# Patient Record
Sex: Female | Born: 1990 | Hispanic: No | Marital: Single | Smoking: Never smoker
Health system: Southern US, Community
[De-identification: ages and names within clinical notes are randomized; demographics above are authoritative.]

## PROBLEM LIST (undated history)

## (undated) DIAGNOSIS — J45909 Unspecified asthma, uncomplicated: Secondary | ICD-10-CM

## (undated) DIAGNOSIS — K219 Gastro-esophageal reflux disease without esophagitis: Secondary | ICD-10-CM

## (undated) DIAGNOSIS — A499 Bacterial infection, unspecified: Secondary | ICD-10-CM

## (undated) HISTORY — DX: Gastro-esophageal reflux disease without esophagitis: K21.9

---
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
## 2012-06-14 ENCOUNTER — Encounter (HOSPITAL_BASED_OUTPATIENT_CLINIC_OR_DEPARTMENT_OTHER): Payer: Self-pay | Admitting: Emergency Medicine

## 2012-06-14 ENCOUNTER — Emergency Department (HOSPITAL_BASED_OUTPATIENT_CLINIC_OR_DEPARTMENT_OTHER)
Admission: EM | Admit: 2012-06-14 | Discharge: 2012-06-14 | Disposition: A | Discharge status: Home / Self Care | Payer: Medicaid Other | Attending: Emergency Medicine | Admitting: Emergency Medicine

## 2012-06-14 DIAGNOSIS — H1045 Other chronic allergic conjunctivitis: Secondary | ICD-10-CM | POA: Insufficient documentation

## 2012-06-14 DIAGNOSIS — K219 Gastro-esophageal reflux disease without esophagitis: Secondary | ICD-10-CM | POA: Insufficient documentation

## 2012-06-14 DIAGNOSIS — H1010 Acute atopic conjunctivitis, unspecified eye: Secondary | ICD-10-CM

## 2012-06-14 MED ORDER — NAPHAZOLINE-PHENIRAMINE 0.025-0.3 % OP SOLN: 2.0000 [drp] | Freq: Four times a day (QID) | OPHTHALMIC | Status: DC

## 2012-06-14 NOTE — ED Provider Notes (Signed)
History     CSN: [id number]  Arrival date & time 06/14/12  1850   First MD Initiated Contact with Patient 06/14/12 1901      Chief Complaint  Patient presents with  . Conjunctivitis    (Consider location/radiation/quality/duration/timing/severity/associated sxs/prior treatment) Patient is a 21 y.o. female presenting with conjunctivitis. The history is provided by the patient.  Conjunctivitis  The current episode started 3 to 5 days ago. The problem occurs continuously. The problem has been unchanged. Associated symptoms include eye itching, eye discharge and eye redness. Pertinent negatives include no photophobia and no congestion. Associated symptoms comments: Redness, swelling and clear drainage isolated to left eye for 3 days without morning eyelash matting. No injury or foreign body..    Past Medical History  Diagnosis Date  . Gastroesophageal reflux     Avoid dietary chocolate, caffeine & peppermint    History reviewed. No pertinent past surgical history.  Family History  Problem Relation Age of Onset  . GER disease Brother     History  Substance Use Topics  . Smoking status: Not on file  . Smokeless tobacco: Not on file  . Alcohol Use:     OB History    Grav Para Term Preterm Abortions TAB SAB Ect Mult Living                  Review of Systems  HENT: Negative for congestion.   Eyes: Positive for discharge, redness and itching. Negative for photophobia.    Allergies  Review of patient's allergies indicates no known allergies.  Home Medications   Current Outpatient Rx  Name Route Sig Dispense Refill  . OMEPRAZOLE 40 MG PO CPDR Oral Take 1 capsule (40 mg total) by mouth daily. 30 capsule 11    BP 141/73  Pulse 81  Temp 98.4 F (36.9 C) (Oral)  Resp 16  Ht 5\' 6"  (1.676 m)  Wt 180 lb (81.647 kg)  BMI 29.05 kg/m2  SpO2 100%  LMP 05/20/2012  Physical Exam  Constitutional: She is oriented to person, place, and time. She appears well-developed  and well-nourished.  Eyes:       Left eye injected with mild chemosis. No FB visualized. PERRL. FROM. No eye lid swelling or redness. Right eye unremarkable in appearance.  Neck: Normal range of motion.  Pulmonary/Chest: Effort normal.  Neurological: She is alert and oriented to person, place, and time.  Skin: Skin is warm and dry.    ED Course  Procedures (including critical care time)  Labs Reviewed - No data to display No results found.   No diagnosis found.  1. Allergic conjunctivitis.  MDM  Suspect allergic component to symptoms with chemosis. Will treat with antihistamine eye drops and refer to ophthalmology for recheck.        Rodena Medin, PA-C 06/14/12 1942

## 2012-06-14 NOTE — ED Provider Notes (Signed)
Medical screening examination/treatment/procedure(s) were performed by non-physician practitioner and as supervising physician I was immediately available for consultation/collaboration.   Geoffery Lyons, MD 06/14/12 2100

## 2012-06-14 NOTE — ED Notes (Signed)
C/O left eye pain with redness and drainage for past 3 days.

## 2012-09-11 ENCOUNTER — Emergency Department (HOSPITAL_BASED_OUTPATIENT_CLINIC_OR_DEPARTMENT_OTHER)
Admission: EM | Admit: 2012-09-11 | Discharge: 2012-09-12 | Disposition: A | Discharge status: Home / Self Care | Payer: Medicaid Other | Attending: Emergency Medicine | Admitting: Emergency Medicine

## 2012-09-11 ENCOUNTER — Encounter (HOSPITAL_BASED_OUTPATIENT_CLINIC_OR_DEPARTMENT_OTHER): Payer: Self-pay | Admitting: *Deleted

## 2012-09-11 ENCOUNTER — Emergency Department (HOSPITAL_BASED_OUTPATIENT_CLINIC_OR_DEPARTMENT_OTHER): Payer: Medicaid Other

## 2012-09-11 DIAGNOSIS — N390 Urinary tract infection, site not specified: Secondary | ICD-10-CM | POA: Insufficient documentation

## 2012-09-11 DIAGNOSIS — M549 Dorsalgia, unspecified: Secondary | ICD-10-CM

## 2012-09-11 DIAGNOSIS — K219 Gastro-esophageal reflux disease without esophagitis: Secondary | ICD-10-CM | POA: Insufficient documentation

## 2012-09-11 DIAGNOSIS — R300 Dysuria: Secondary | ICD-10-CM | POA: Insufficient documentation

## 2012-09-11 DIAGNOSIS — Z8619 Personal history of other infectious and parasitic diseases: Secondary | ICD-10-CM | POA: Insufficient documentation

## 2012-09-11 DIAGNOSIS — Z3202 Encounter for pregnancy test, result negative: Secondary | ICD-10-CM | POA: Insufficient documentation

## 2012-09-11 DIAGNOSIS — Z79899 Other long term (current) drug therapy: Secondary | ICD-10-CM | POA: Insufficient documentation

## 2012-09-11 HISTORY — DX: Bacterial infection, unspecified: A49.9

## 2012-09-11 LAB — URINALYSIS, ROUTINE W REFLEX MICROSCOPIC
Bilirubin Urine: NEGATIVE
Glucose, UA: NEGATIVE mg/dL
Ketones, ur: NEGATIVE mg/dL
Nitrite: NEGATIVE
Protein, ur: 100 mg/dL — AB
Specific Gravity, Urine: 1.03 (ref 1.005–1.030)
Urobilinogen, UA: 0.2 mg/dL (ref 0.0–1.0)
pH: 5 (ref 5.0–8.0)

## 2012-09-11 LAB — URINE MICROSCOPIC-ADD ON

## 2012-09-11 LAB — PREGNANCY, URINE: Preg Test, Ur: NEGATIVE

## 2012-09-11 MED ORDER — HYDROCODONE-ACETAMINOPHEN 5-325 MG PO TABS: 2.0000 | ORAL_TABLET | ORAL | Status: DC | PRN

## 2012-09-11 MED ORDER — CEPHALEXIN 500 MG PO CAPS: 500.0000 mg | ORAL_CAPSULE | Freq: Four times a day (QID) | ORAL | Status: DC
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
## 2012-09-11 NOTE — ED Notes (Signed)
Patient states that she is having lower right back pain, and urine frequency and burning.
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
## 2012-09-12 MED ORDER — ONDANSETRON 4 MG PO TBDP
4.0000 mg | ORAL_TABLET | Freq: Once | ORAL | Status: AC
  Administered 2012-09-12: 4 mg via ORAL
  Filled 2012-09-12: qty 1

## 2012-09-12 NOTE — ED Provider Notes (Signed)
Medical screening examination/treatment/procedure(s) were performed by non-physician practitioner and as supervising physician I was immediately available for consultation/collaboration.   Dariel Pellecchia Y. Indya Oliveria, MD 09/12/12 1609 

## 2012-09-12 NOTE — ED Notes (Signed)
Pt c/o nausea at d/c. MD updated.

## 2012-09-13 LAB — URINE CULTURE

## 2013-10-08 IMAGING — CR DG LUMBAR SPINE COMPLETE 4+V
5 series · 5 of 5 positions shown · non-contrast
Comparison: None.

CLINICAL DATA: Back pain

LUMBAR SPINE - COMPLETE 4+ VIEW

[t l-spine a.p.]
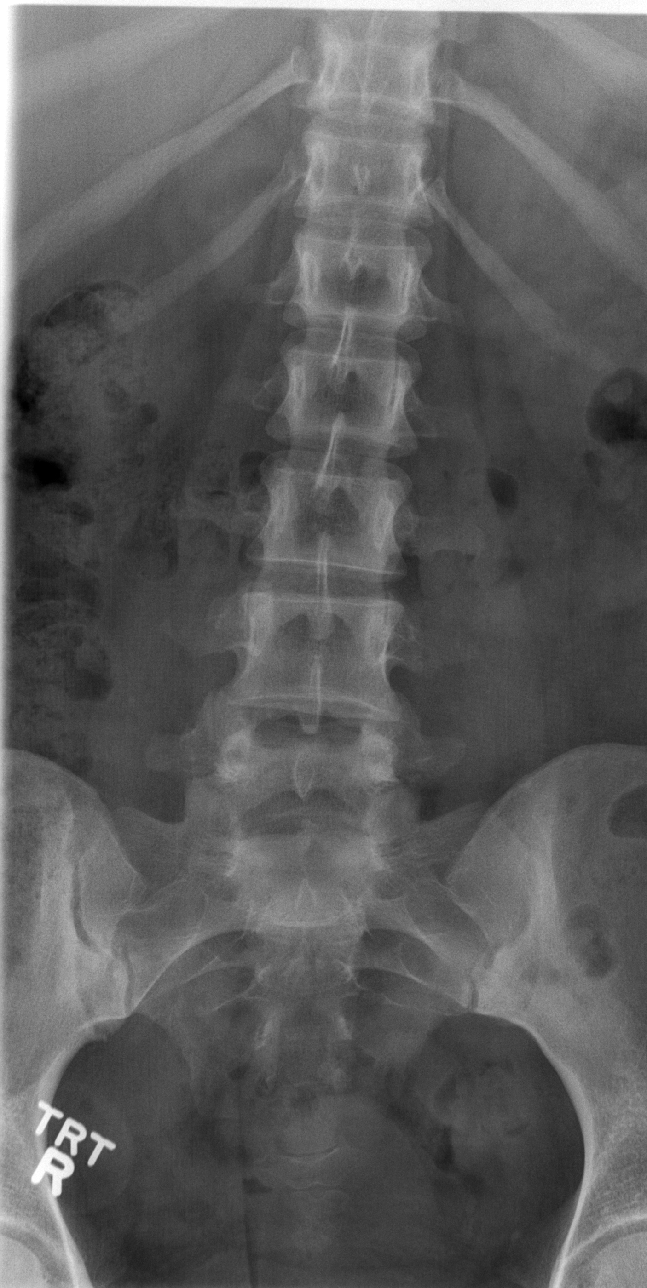

[t l-spine oblique exposure (1 of 2)]
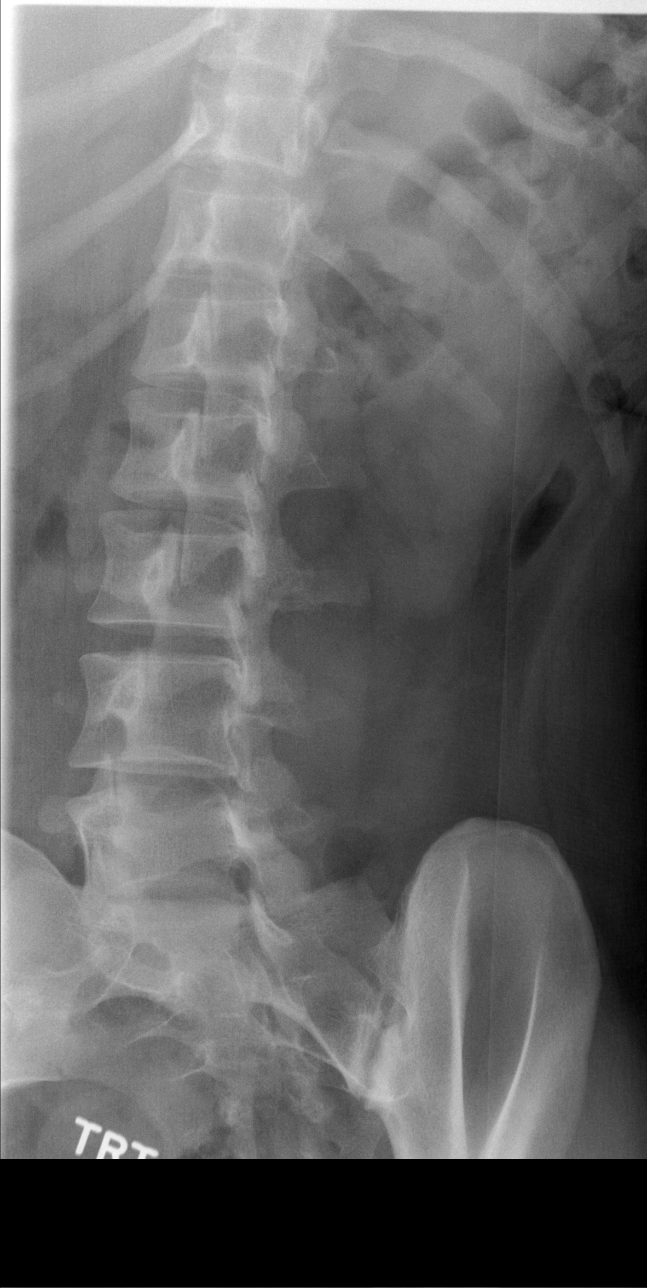

[t l-spine oblique exposure (2 of 2)]
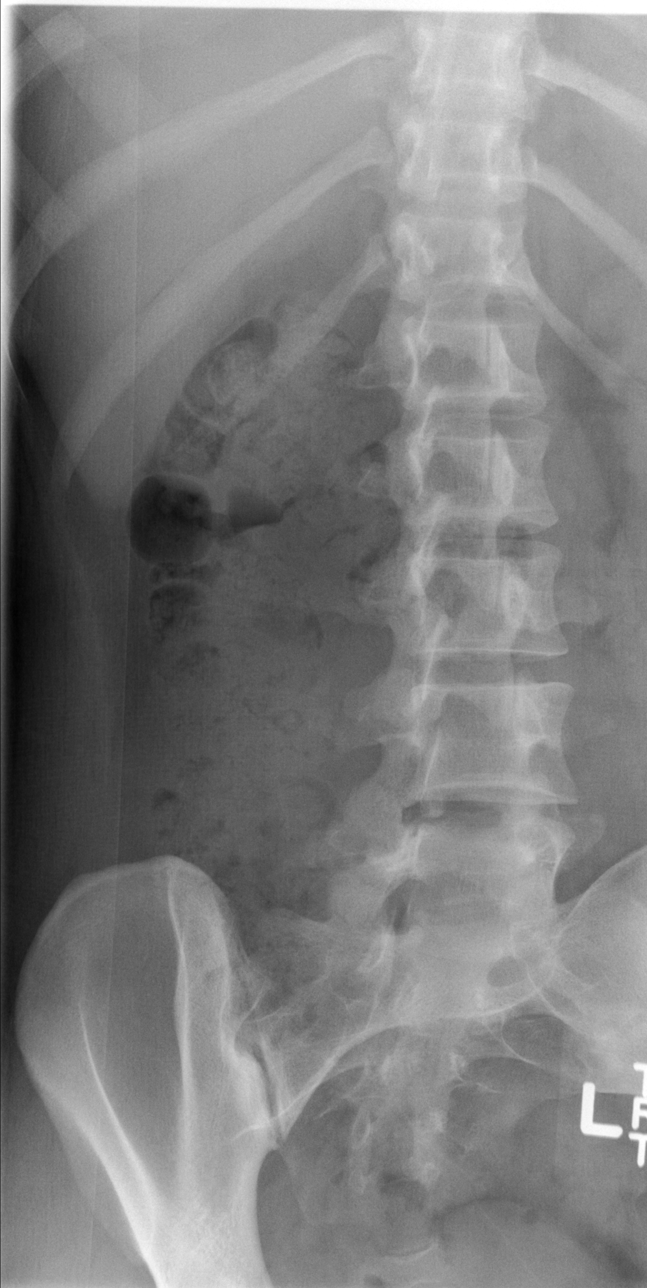

[t l-spine lat]
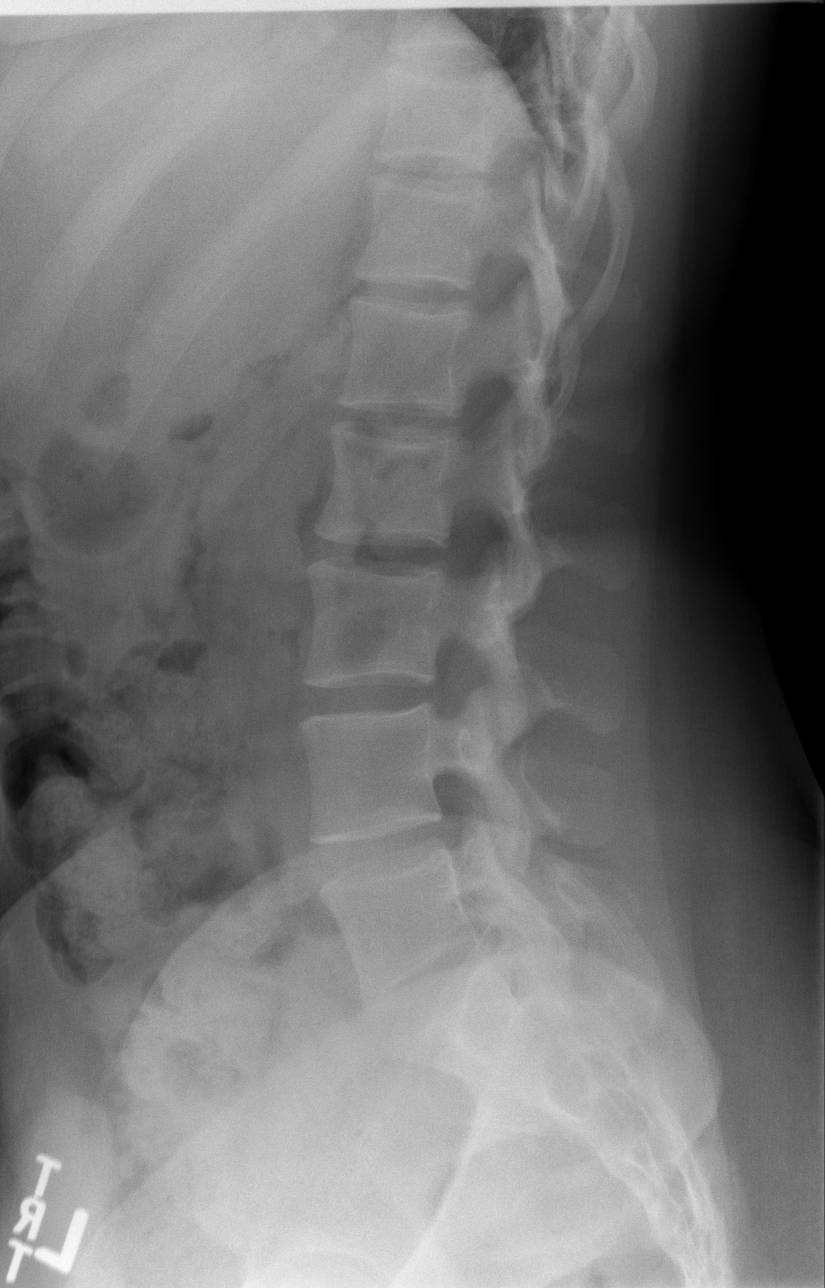

[t l-spine l5-s1 spot]
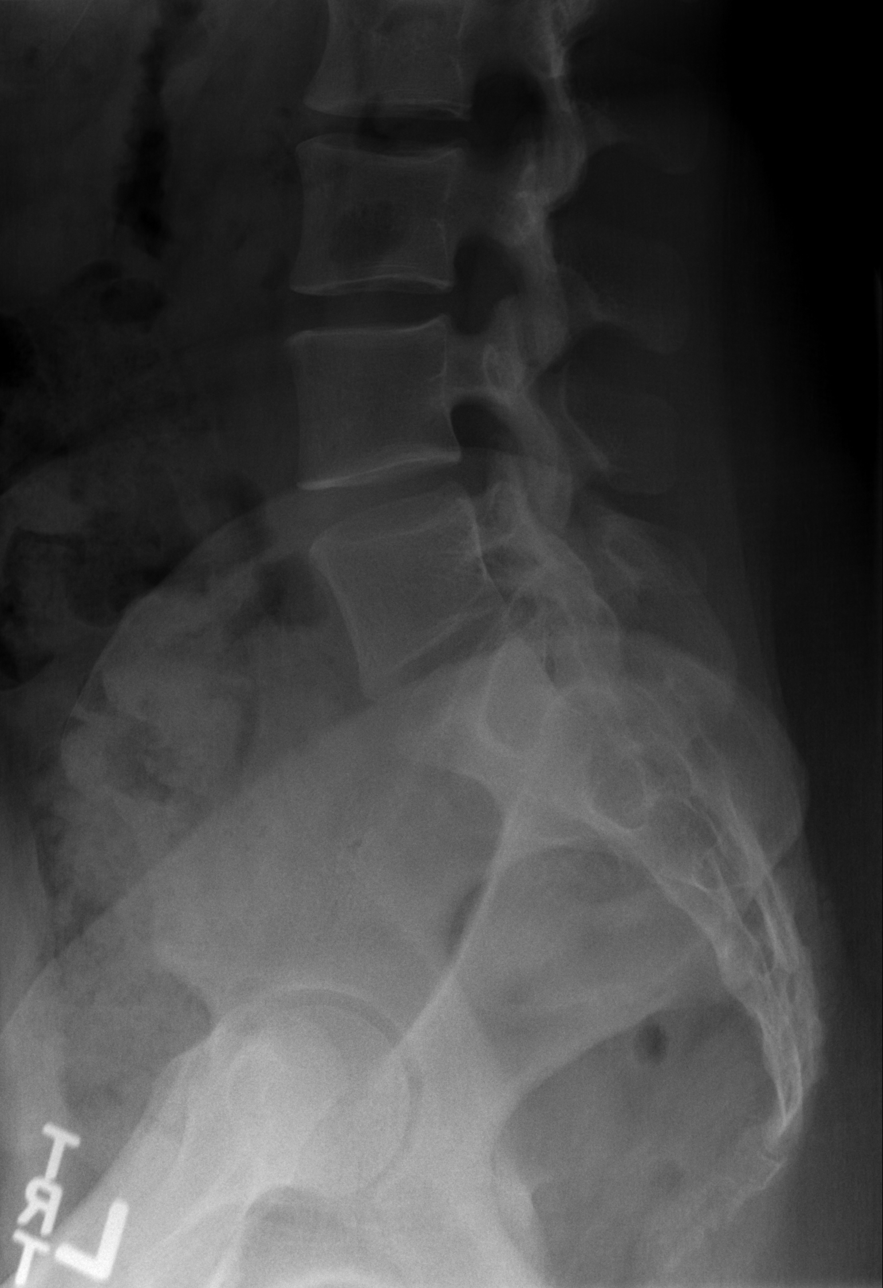

[5 of 5 positions shown; findings below may reference images not displayed]

FINDINGS: Normal alignment of the lumbar vertebral bodies.  No loss
of vertebral body height or disc height.  No subluxation.  No pars
fracture.
IMPRESSION: No acute osseous abnormality.

## 2015-03-25 ENCOUNTER — Encounter (HOSPITAL_BASED_OUTPATIENT_CLINIC_OR_DEPARTMENT_OTHER): Payer: Self-pay | Admitting: Emergency Medicine

## 2015-03-25 ENCOUNTER — Emergency Department (HOSPITAL_BASED_OUTPATIENT_CLINIC_OR_DEPARTMENT_OTHER)
Admission: EM | Admit: 2015-03-25 | Discharge: 2015-03-25 | Disposition: A | Discharge status: Home / Self Care | Payer: Medicaid Other | Attending: Emergency Medicine | Admitting: Emergency Medicine

## 2015-03-25 DIAGNOSIS — K219 Gastro-esophageal reflux disease without esophagitis: Secondary | ICD-10-CM | POA: Insufficient documentation

## 2015-03-25 DIAGNOSIS — J069 Acute upper respiratory infection, unspecified: Secondary | ICD-10-CM | POA: Insufficient documentation

## 2015-03-25 DIAGNOSIS — H6503 Acute serous otitis media, bilateral: Secondary | ICD-10-CM | POA: Insufficient documentation

## 2015-03-25 DIAGNOSIS — Z79899 Other long term (current) drug therapy: Secondary | ICD-10-CM | POA: Insufficient documentation

## 2015-03-25 DIAGNOSIS — Z8619 Personal history of other infectious and parasitic diseases: Secondary | ICD-10-CM | POA: Insufficient documentation

## 2015-03-25 LAB — RAPID STREP SCREEN (MED CTR MEBANE ONLY): STREPTOCOCCUS, GROUP A SCREEN (DIRECT): NEGATIVE

## 2015-03-25 MED ORDER — AMOXICILLIN 500 MG PO CAPS: 1000.0000 mg | ORAL_CAPSULE | Freq: Three times a day (TID) | ORAL | Status: DC

## 2015-03-25 MED ORDER — ONDANSETRON 8 MG PO TBDP: 8.0000 mg | ORAL_TABLET | Freq: Three times a day (TID) | ORAL | Status: AC | PRN

## 2015-03-25 MED ORDER — HYDROCODONE-ACETAMINOPHEN 5-325 MG PO TABS: 1.0000 | ORAL_TABLET | Freq: Four times a day (QID) | ORAL | Status: AC | PRN

## 2015-03-25 MED ORDER — AMOXICILLIN 500 MG PO CAPS
1000.0000 mg | ORAL_CAPSULE | Freq: Once | ORAL | Status: AC
  Administered 2015-03-25: 1000 mg via ORAL
  Filled 2015-03-25: qty 2

## 2015-03-25 NOTE — ED Notes (Signed)
Pt reports sore throat and bilat ear pain x 1 week as well as nasal congestion

## 2015-03-25 NOTE — ED Provider Notes (Signed)
CSN: [id number]     Arrival date & time 03/25/15  0029 History   First MD Initiated Contact with Patient 03/25/15 [phone]     Chief Complaint  Patient presents with  . Sore Throat and Ear Pain      (Consider location/radiation/quality/duration/timing/severity/associated sxs/prior Treatment) HPI  This is a 24 year old female with a 6 day history of cold symptoms. She has had nasal congestion, productive cough, nausea, vomiting, sore throat and now pain in her right ear. She has been using over-the-counter medications without adequate relief. She has not had diarrhea. She states the pain in her right ear is been severe enough to have her crying. Her ears feel stuffy and her hearing is decreased.  Past Medical History  Diagnosis Date  . Gastroesophageal reflux     Avoid dietary chocolate, caffeine & peppermint  . Bacterial infection    History reviewed. No pertinent past surgical history. Family History  Problem Relation Age of Onset  . GER disease Brother    History  Substance Use Topics  . Smoking status: Never Smoker   . Smokeless tobacco: Never Used  . Alcohol Use: No   OB History    No data available     Review of Systems  All other systems reviewed and are negative.   Allergies  Review of patient's allergies indicates no known allergies.  Home Medications   Prior to Admission medications   Medication Sig Start Date End Date Taking? Authorizing Provider  cephALEXin (KEFLEX) 500 MG capsule Take 1 capsule (500 mg total) by mouth 4 (four) times daily. 09/11/12   Elson Areas, PA-C  HYDROcodone-acetaminophen (NORCO/VICODIN) 5-325 MG per tablet Take 2 tablets by mouth every 4 (four) hours as needed for pain. 09/11/12   Elson Areas, PA-C  metroNIDAZOLE (FLAGYL) 500 MG tablet Take 500 mg by mouth 3 (three) times daily.    Historical Provider, MD  naphazoline-pheniramine (NAPHCON-A) 0.025-0.3 % ophthalmic solution Place 2 drops into the left eye 4 (four) times daily.  06/14/12   Elpidio Anis, PA-C  omeprazole (PRILOSEC) 40 MG capsule Take 1 capsule (40 mg total) by mouth daily. 04/08/11   Jon Gills, MD   BP 133/82 mmHg  Pulse 60  Temp(Src) 98 F (36.7 C) (Oral)  Resp 17  SpO2 100%  LMP 03/12/2015   Physical Exam  General: Well-developed, well-nourished female in no acute distress; appearance consistent with age of record HENT: normocephalic; atraumatic; pharyngeal erythema without exudate; nasal congestion; TMs without erythema Eyes: pupils equal, round and reactive to light; extraocular muscles intact Neck: supple Heart: regular rate and rhythm Lungs: clear to auscultation bilaterally Abdomen: soft; nondistended; nontender; bowel sounds present Extremities: No deformity; full range of motion Neurologic: Awake, alert and oriented; motor function intact in all extremities and symmetric; no facial droop Skin: Warm and dry Psychiatric: Normal mood and affect    ED Course  Procedures (including critical care time)   MDM   Nursing notes and vitals signs, including pulse oximetry, reviewed.  Summary of this visit's results, reviewed by myself:  Labs:  Results for orders placed or performed during the hospital encounter of 03/25/15 (from the past 24 hour(s))  Rapid strep screen     Status: None   Collection Time: 03/25/15  1:35 AM  Result Value Ref Range   Streptococcus, Group A Screen (Direct) NEGATIVE NEGATIVE   History and exam consistent with serous otitis media. Because her symptoms are worsening despite treatment we will treat with an anti-biotic.  Paula Libra, MD 03/25/15 [phone]

## 2015-03-25 NOTE — ED Notes (Addendum)
Pt complains of pain in BIL ears R>L, and sore throat 7/10 since Monday. Has been treating at home with honey, ibuprofen, dayquil and Mucinex. Patient took Dayquil, Ibuprofen, and Zycam around 2300. Also complains of productive cough with "a fair amount" of clear/yellow mucous, nausea and vomiting but denies diarrhea. Only complains of dizziness "if I get up too quick".

## 2015-03-28 LAB — CULTURE, GROUP A STREP: Strep A Culture: NEGATIVE

## 2017-09-16 ENCOUNTER — Other Ambulatory Visit: Payer: Self-pay

## 2017-09-16 ENCOUNTER — Emergency Department (HOSPITAL_BASED_OUTPATIENT_CLINIC_OR_DEPARTMENT_OTHER)
Admission: EM | Admit: 2017-09-16 | Discharge: 2017-09-16 | Disposition: A | Discharge status: Home / Self Care | Payer: Self-pay | Attending: Emergency Medicine | Admitting: Emergency Medicine

## 2017-09-16 ENCOUNTER — Encounter (HOSPITAL_BASED_OUTPATIENT_CLINIC_OR_DEPARTMENT_OTHER): Payer: Self-pay

## 2017-09-16 DIAGNOSIS — J45909 Unspecified asthma, uncomplicated: Secondary | ICD-10-CM | POA: Insufficient documentation

## 2017-09-16 DIAGNOSIS — R350 Frequency of micturition: Secondary | ICD-10-CM | POA: Insufficient documentation

## 2017-09-16 DIAGNOSIS — Z79899 Other long term (current) drug therapy: Secondary | ICD-10-CM | POA: Insufficient documentation

## 2017-09-16 DIAGNOSIS — M545 Low back pain: Secondary | ICD-10-CM | POA: Insufficient documentation

## 2017-09-16 HISTORY — DX: Unspecified asthma, uncomplicated: J45.909

## 2017-09-16 LAB — URINALYSIS, MICROSCOPIC (REFLEX)

## 2017-09-16 LAB — URINALYSIS, ROUTINE W REFLEX MICROSCOPIC
Bilirubin Urine: NEGATIVE
Glucose, UA: NEGATIVE mg/dL
Hgb urine dipstick: NEGATIVE
KETONES UR: NEGATIVE mg/dL
NITRITE: NEGATIVE
PROTEIN: NEGATIVE mg/dL
pH: 5.5 (ref 5.0–8.0)

## 2017-09-16 LAB — PREGNANCY, URINE: PREG TEST UR: NEGATIVE

## 2017-09-16 MED ORDER — METHOCARBAMOL 500 MG PO TABS: 500.0000 mg | ORAL_TABLET | Freq: Two times a day (BID) | ORAL | 0 refills | Status: AC

## 2017-09-16 MED ORDER — SULFAMETHOXAZOLE-TRIMETHOPRIM 800-160 MG PO TABS: ORAL_TABLET | ORAL | 0 refills | Status: DC

## 2017-09-16 MED ORDER — KETOROLAC TROMETHAMINE 60 MG/2ML IM SOLN
30.0000 mg | Freq: Once | INTRAMUSCULAR | Status: AC
  Administered 2017-09-16: 30 mg via INTRAMUSCULAR
  Filled 2017-09-16: qty 2

## 2017-09-16 NOTE — ED Provider Notes (Signed)
[facility] EMERGENCY DEPARTMENT Provider Note   CSN: [id number] Arrival date & time: 09/16/17  1620     History   Chief Complaint Chief Complaint  Patient presents with  . Back Pain    HPI Alexandria Espinoza is a 27 y.o. female.  The history is provided by the patient and medical records. No language interpreter was used.  Back Pain   Pertinent negatives include no fever, no numbness, no abdominal pain, no dysuria and no weakness.   Alexandria Espinoza is a 27 y.o. female  with a PMH of asthma, gerd who presents to the Emergency Department complaining of right lower back pain x 4-5 days.  Patient states the pain will intermittently radiate to the back of her leg, stopping at the knee, with certain movements.  She has tried taking ibuprofen once and using heating pad multiple times with little improvement.  Denies any dysuria or vaginal discharge, but does report urinary frequency over this timeframe as well. Patient denies upper back or neck pain. No fever, nausea, vomiting, abdominal pain, saddle anesthesia, weakness, numbness, urinary complaints including retention/incontinence. No history of cancer, IVDU, or recent spinal procedures. No hx of similar.   Past Medical History:  Diagnosis Date  . Asthma   . Bacterial infection   . Gastroesophageal reflux    Avoid dietary chocolate, caffeine & peppermint    Patient Active Problem List   Diagnosis Date Noted  . Obesity (BMI 30-39.9) 04/08/2011  . Gastroesophageal reflux     History reviewed. No pertinent surgical history.  OB History    No data available       Home Medications    Prior to Admission medications   Medication Sig Start Date End Date Taking? Authorizing Provider  amoxicillin (AMOXIL) 500 MG capsule Take 2 capsules (1,000 mg total) by mouth 3 (three) times daily. 03/25/15   Molpus, John, MD  HYDROcodone-acetaminophen (NORCO/VICODIN) 5-325 MG per tablet Take 1-2 tablets by mouth every 6 (six) hours as needed.  03/25/15   Molpus, John, MD  methocarbamol (ROBAXIN) 500 MG tablet Take 1 tablet (500 mg total) by mouth 2 (two) times daily. 09/16/17   Yoland Scherr, Chase Picket, PA-C  ondansetron (ZOFRAN ODT) 8 MG disintegrating tablet Take 1 tablet (8 mg total) by mouth every 8 (eight) hours as needed for nausea or vomiting. 03/25/15   Molpus, Jonny Ruiz, MD  sulfamethoxazole-trimethoprim (BACTRIM DS,SEPTRA DS) 800-160 MG tablet Take 1 tablet by mouth 2 (two) times daily x 3 days. 09/16/17   Mahkayla Preece, Chase Picket, PA-C    Family History Family History  Problem Relation Age of Onset  . GER disease Brother     Social History Social History   Tobacco Use  . Smoking status: Never Smoker  . Smokeless tobacco: Never Used  Substance Use Topics  . Alcohol use: No  . Drug use: No     Allergies   Patient has no known allergies.   Review of Systems Review of Systems  Constitutional: Negative for chills and fever.  Gastrointestinal: Negative for abdominal pain, diarrhea, nausea and vomiting.  Genitourinary: Positive for frequency. Negative for difficulty urinating, dysuria and vaginal discharge.  Musculoskeletal: Positive for back pain. Negative for neck pain.  Neurological: Negative for weakness and numbness.     Physical Exam Updated Vital Signs BP (!) 137/94 (BP Location: Right Arm)   Pulse 70   Temp 98.5 F (36.9 C) (Oral)   Resp 18   Ht 5\' 7"  (1.702 m)   Wt 97.5 kg (  215 lb)   LMP 08/17/2017   SpO2 100%   BMI 33.67 kg/m   Physical Exam  Constitutional: She is oriented to person, place, and time. She appears well-developed and well-nourished.  Neck:  No midline or paraspinal tenderness. Full ROM without pain.  Cardiovascular: Normal rate, regular rhythm, normal heart sounds and intact distal pulses.  Pulmonary/Chest: Effort normal and breath sounds normal. No respiratory distress.  Abdominal: Soft. Bowel sounds are normal. She exhibits no distension. There is no rebound and no guarding.  Mild  suprapubic tenderness.  No CVA tenderness.  Musculoskeletal:       Back:  No midline T/L tenderness. Tenderness to palpation as depicted in image. 5/5 muscle strength in bilateral LE's. Straight leg raises are negative bilaterally for radicular symptoms, but right SLR does reproduce low back pain. Able to ambulate independently with steady gait.  Neurological: She is alert and oriented to person, place, and time. She has normal reflexes.  Bilateral lower extremities neurovascularly intact.  Skin: Skin is warm and dry. No rash noted. No erythema.  Nursing note and vitals reviewed.    ED Treatments / Results  Labs (all labs ordered are listed, but only abnormal results are displayed) Labs Reviewed  URINALYSIS, ROUTINE W REFLEX MICROSCOPIC - Abnormal; Notable for the following components:      Result Value   APPearance HAZY (*)    Specific Gravity, Urine >1.030 (*)    Leukocytes, UA MODERATE (*)    All other components within normal limits  URINALYSIS, MICROSCOPIC (REFLEX) - Abnormal; Notable for the following components:   Bacteria, UA MANY (*)    Squamous Epithelial / LPF 6-30 (*)    All other components within normal limits  URINE CULTURE  PREGNANCY, URINE    EKG  EKG Interpretation None       Radiology No results found.  Procedures Procedures (including critical care time)  Medications Ordered in ED Medications  ketorolac (TORADOL) injection 30 mg (30 mg Intramuscular Given 09/16/17 1749)     Initial Impression / Assessment and Plan / ED Course  I have reviewed the triage vital signs and the nursing notes.  Pertinent labs & imaging results that were available during my care of the patient were reviewed by me and considered in my medical decision making (see chart for details).    Alexandria Espinoza is a 27 y.o. female who presents to ED for right-sided low back pain. No red flag symptoms of back pain. Exam c/w musk etiology and she is overtly tender to the right  buttock and lower paraspinal musculature. She has had no dysuria, but has been experiencing urinary frequency. No CVA tenderness. Afebrile. UA with moderate leuks, 6-30 wbc's and many bacteria. No vaginal discharge and not sexually active. Urine sent for cx's but given frequency, will treat with 3 days course of ABX in addition to symptomatic care for low back pain. PCP follow up if symptoms persist. Reasons to return to ER discussed. All questions answered.   Final Clinical Impressions(s) / ED Diagnoses   Final diagnoses:  Urinary frequency  Acute right-sided low back pain, with sciatica presence unspecified    ED Discharge Orders        Ordered    sulfamethoxazole-trimethoprim (BACTRIM DS,SEPTRA DS) 800-160 MG tablet     09/16/17 1746    methocarbamol (ROBAXIN) 500 MG tablet  2 times daily     09/16/17 1746       Jacobb Alen, Chase PicketJaime Pilcher, PA-C 09/16/17 1809  Loren Racer, MD 09/18/17 [phone]

## 2017-09-16 NOTE — Discharge Instructions (Signed)
It was my pleasure taking care of you today!   Please take all of your antibiotics until finished!   Take 600 mg ibuprofen (3 over-the-counter tablets) with breakfast, lunch and dinner for pain. Robaxin is your muscle relaxer to take as needed. Ice or heat can also help with pain.   Follow up with your primary doctor if symptoms are not improved in the next week.   Return to ER for fevers, vomiting, numbness or weakness in your legs, new or worsening symptoms, any additional concerns.

## 2017-09-16 NOTE — ED Triage Notes (Signed)
Right side lower back pain that radiates to right leg since Sunday, worse with movement, not relieved by one dose of ibuprofen or one episode of using heating pad

## 2017-09-18 LAB — URINE CULTURE

## 2017-09-19 ENCOUNTER — Telehealth: Payer: Self-pay

## 2017-09-19 NOTE — Telephone Encounter (Signed)
No action needed for UC  [facility]

## 2018-02-03 ENCOUNTER — Emergency Department (HOSPITAL_BASED_OUTPATIENT_CLINIC_OR_DEPARTMENT_OTHER)
Admission: EM | Admit: 2018-02-03 | Discharge: 2018-02-03 | Disposition: A | Discharge status: Home / Self Care | Payer: Self-pay | Attending: Emergency Medicine | Admitting: Emergency Medicine

## 2018-02-03 ENCOUNTER — Encounter (HOSPITAL_BASED_OUTPATIENT_CLINIC_OR_DEPARTMENT_OTHER): Payer: Self-pay

## 2018-02-03 ENCOUNTER — Other Ambulatory Visit: Payer: Self-pay

## 2018-02-03 DIAGNOSIS — J45909 Unspecified asthma, uncomplicated: Secondary | ICD-10-CM | POA: Insufficient documentation

## 2018-02-03 DIAGNOSIS — R05 Cough: Secondary | ICD-10-CM | POA: Insufficient documentation

## 2018-02-03 DIAGNOSIS — R059 Cough, unspecified: Secondary | ICD-10-CM

## 2018-02-03 MED ORDER — BENZONATATE 100 MG PO CAPS: 100.0000 mg | ORAL_CAPSULE | Freq: Three times a day (TID) | ORAL | 0 refills | Status: AC | PRN

## 2018-02-03 MED FILL — BENZONATATE 100 MG CAPSULE: 100 | 7 days supply | Qty: 21 | Fill #0

## 2018-02-03 NOTE — Discharge Instructions (Signed)
It was my pleasure taking care of you today!  Tessalon as needed for cough. Take Claritin or Zyrtec daily.  Alternate between Tylenol and ibuprofen as needed for body aches / fevers.   Rest, drink plenty of fluids to be sure you are staying hydrated.   Please follow up with your primary doctor for discussion of your diagnoses and further evaluation after today's visit if symptoms persist longer than 7 days; Return to the ER for high fevers, difficulty breathing or other concerning symptoms

## 2018-02-03 NOTE — ED Notes (Signed)
Pt verbalizes understanding of d/c instructions and denies any further needs at this time. 

## 2018-02-03 NOTE — ED Provider Notes (Signed)
[facility] EMERGENCY DEPARTMENT Provider Note   CSN: [id number] Arrival date & time: 02/03/18  1615     History   Chief Complaint Chief Complaint  Patient presents with  . Cough    HPI Alexandria Espinoza is a 27 y.o. female.  The history is provided by the patient and medical records. No language interpreter was used.  Cough  Pertinent negatives include no chest pain, no chills and no sore throat.   Alexandria Espinoza is a 27 y.o. female who presents to the Emergency Department complaining of persistent cough x 3-4 weeks. [facility] at symptom onset when she was also experiencing sore throat, nasal congestion. Rapid strep negative, but she was treated with PCN because they saw white spots on her throat and wanted to be safe. Symptoms did somewhat improve however cough has been persistent. [place] and since returning, feels like symptoms have returned as well. Notes cough and nasal congestion. Denies sore throat, fever, chills, abdominal pain, n/v.   Past Medical History:  Diagnosis Date  . Asthma   . Bacterial infection   . Gastroesophageal reflux    Avoid dietary chocolate, caffeine & peppermint    Patient Active Problem List   Diagnosis Date Noted  . Obesity (BMI 30-39.9) 04/08/2011  . Gastroesophageal reflux     History reviewed. No pertinent surgical history.   OB History   None      Home Medications    Prior to Admission medications   Medication Sig Start Date End Date Taking? Authorizing Provider  amoxicillin (AMOXIL) 500 MG capsule Take 2 capsules (1,000 mg total) by mouth 3 (three) times daily. 03/25/15   Molpus, John, MD  benzonatate (TESSALON) 100 MG capsule Take 1 capsule (100 mg total) by mouth 3 (three) times daily as needed for cough. 02/03/18   Ward, Chase PicketJaime Pilcher, PA-C  HYDROcodone-acetaminophen (NORCO/VICODIN) 5-325 MG per tablet Take 1-2 tablets by mouth every 6 (six) hours as needed. 03/25/15    Molpus, John, MD  methocarbamol (ROBAXIN) 500 MG tablet Take 1 tablet (500 mg total) by mouth 2 (two) times daily. 09/16/17   Ward, Chase PicketJaime Pilcher, PA-C  ondansetron (ZOFRAN ODT) 8 MG disintegrating tablet Take 1 tablet (8 mg total) by mouth every 8 (eight) hours as needed for nausea or vomiting. 03/25/15   Molpus, Jonny RuizJohn, MD  sulfamethoxazole-trimethoprim (BACTRIM DS,SEPTRA DS) 800-160 MG tablet Take 1 tablet by mouth 2 (two) times daily x 3 days. 09/16/17   Ward, Chase PicketJaime Pilcher, PA-C  omeprazole (PRILOSEC) 40 MG capsule Take 1 capsule (40 mg total) by mouth daily. 04/08/11 03/25/15  Jon Gillslark, Joseph H, MD    Family History Family History  Problem Relation Age of Onset  . GER disease Brother     Social History Social History   Tobacco Use  . Smoking status: Never Smoker  . Smokeless tobacco: Never Used  Substance Use Topics  . Alcohol use: No  . Drug use: No     Allergies   Patient has no known allergies.   Review of Systems Review of Systems  Constitutional: Negative for chills and fever.  HENT: Positive for congestion and sinus pressure. Negative for sore throat.   Respiratory: Positive for cough.   Cardiovascular: Negative for chest pain.  Gastrointestinal: Negative for abdominal pain, nausea and vomiting.     Physical Exam Updated Vital Signs BP (!) 141/82 (BP Location: Left Arm)   Pulse 87   Temp 98.4 F (  36.9 C) (Oral)   Resp 18   Ht 5\' 7"  (1.702 m)   Wt 97.5 kg (215 lb)   LMP 01/27/2018   SpO2 100%   BMI 33.67 kg/m   Physical Exam  Constitutional: She is oriented to person, place, and time. She appears well-developed and well-nourished. No distress.  Well appearing.  HENT:  Head: Normocephalic and atraumatic.  OP with erythema, no exudates or tonsillar hypertrophy. + PND. No focal area of sinus tenderness.  Neck: Normal range of motion. Neck supple.  Cardiovascular: Normal rate, regular rhythm and normal heart sounds.  Pulmonary/Chest: Effort normal.  Lungs  are clear to auscultation bilaterally - no w/r/r  Abdominal: Soft. She exhibits no distension. There is no tenderness.  Musculoskeletal: Normal range of motion.  Neurological: She is alert and oriented to person, place, and time.  Skin: Skin is warm and dry. She is not diaphoretic.  Nursing note and vitals reviewed.    ED Treatments / Results  Labs (all labs ordered are listed, but only abnormal results are displayed) Labs Reviewed - No data to display  EKG None  Radiology No results found.  Procedures Procedures (including critical care time)  Medications Ordered in ED Medications - No data to display   Initial Impression / Assessment and Plan / ED Course  I have reviewed the triage vital signs and the nursing notes.  Pertinent labs & imaging results that were available during my care of the patient were reviewed by me and considered in my medical decision making (see chart for details).    Alexandria Espinoza is a 27 y.o. female who presents to ED for persistent cough x 3-4 weeks. She felt as if symptoms improved, but then worsened about 3 days ago. She did go to [facility] and notes drastic change in weather there.    On exam, patient is afebrile, non-toxic appearing with a clear lung exam. OP with erythema - no exudates or tonsillar hypertrophy. + PND. No focal sinus tenderness.   Evaluation does not show pathology that would require ongoing emergent intervention or inpatient treatment. Will treat symptomatically and have patient follow up with PCP if symptoms persist.   Symptomatic home care instructions discussed. Rx for tessalon given. PCP follow up strongly encouraged if symptoms persist. Reasons to return to ER discussed. All questions answered.   Blood pressure (!) 141/82, pulse 87, temperature 98.4 F (36.9 C), temperature source Oral, resp. rate 18, height 5\' 7"  (1.702 m), weight 97.5 kg (215 lb), last menstrual period 01/27/2018, SpO2 100 %.   Final Clinical  Impressions(s) / ED Diagnoses   Final diagnoses:  Cough    ED Discharge Orders        Ordered    benzonatate (TESSALON) 100 MG capsule  3 times daily PRN     02/03/18 1646       Ward, Chase Picket, PA-C 02/03/18 1653    Gwyneth Sprout, MD 02/03/18 2338

## 2018-02-03 NOTE — ED Triage Notes (Signed)
Pt c/o chronic cough for about a month that went away two weeks ago and came back stronger about three days ago, pt has tried claritin without relief

## 2020-08-12 ENCOUNTER — Other Ambulatory Visit: Payer: Self-pay

## 2020-08-12 ENCOUNTER — Encounter (HOSPITAL_BASED_OUTPATIENT_CLINIC_OR_DEPARTMENT_OTHER): Payer: Self-pay | Admitting: Emergency Medicine

## 2020-08-12 ENCOUNTER — Emergency Department (HOSPITAL_BASED_OUTPATIENT_CLINIC_OR_DEPARTMENT_OTHER)
Admission: EM | Admit: 2020-08-12 | Discharge: 2020-08-12 | Disposition: A | Discharge status: Home / Self Care | Payer: Self-pay | Attending: Emergency Medicine | Admitting: Emergency Medicine

## 2020-08-12 DIAGNOSIS — L739 Follicular disorder, unspecified: Secondary | ICD-10-CM | POA: Insufficient documentation

## 2020-08-12 DIAGNOSIS — J45909 Unspecified asthma, uncomplicated: Secondary | ICD-10-CM | POA: Insufficient documentation

## 2020-08-12 MED ORDER — CLINDAMYCIN HCL 300 MG PO CAPS: 300.0000 mg | ORAL_CAPSULE | Freq: Three times a day (TID) | ORAL | 0 refills | Status: AC

## 2020-08-12 NOTE — ED Provider Notes (Signed)
[facility] EMERGENCY DEPARTMENT Provider Note   CSN: [id number] Arrival date & time: 08/12/20  0133     History Chief Complaint  Patient presents with   Abscess    To left side of face    Alexandria Espinoza is a 29 y.o. female.   Abscess Location:  Face Facial abscess location:  Chin Abscess quality: fluctuance and painful   Red streaking: no   Progression:  Unchanged Pain details:    Quality:  No pain   Severity:  Mild   Timing:  Constant   Progression:  Unchanged Chronicity:  New Relieved by:  Nothing Worsened by:  Nothing Associated symptoms: no fever        Past Medical History:  Diagnosis Date   Asthma    Bacterial infection    Gastroesophageal reflux    Avoid dietary chocolate, caffeine & peppermint    Patient Active Problem List   Diagnosis Date Noted   Obesity (BMI 30-39.9) 04/08/2011   Gastroesophageal reflux     History reviewed. No pertinent surgical history.   OB History   No obstetric history on file.     Family History  Problem Relation Age of Onset   GER disease Brother     Social History   Tobacco Use   Smoking status: Never Smoker   Smokeless tobacco: Never Used  Substance Use Topics   Alcohol use: No   Drug use: No    Home Medications Prior to Admission medications   Medication Sig Start Date End Date Taking? Authorizing Provider  amoxicillin (AMOXIL) 500 MG capsule Take 2 capsules (1,000 mg total) by mouth 3 (three) times daily. 03/25/15   Molpus, John, MD  benzonatate (TESSALON) 100 MG capsule Take 1 capsule (100 mg total) by mouth 3 (three) times daily as needed for cough. 02/03/18   Ward, Chase Picket, PA-C  clindamycin (CLEOCIN) 300 MG capsule Take 1 capsule (300 mg total) by mouth 3 (three) times daily for 10 days. 08/12/20 08/22/20  Ivannah Zody, DO  HYDROcodone-acetaminophen (NORCO/VICODIN) 5-325 MG per tablet Take 1-2 tablets by mouth every 6 (six) hours as needed. 03/25/15   Molpus, John, MD   methocarbamol (ROBAXIN) 500 MG tablet Take 1 tablet (500 mg total) by mouth 2 (two) times daily. 09/16/17   Ward, Chase Picket, PA-C  ondansetron (ZOFRAN ODT) 8 MG disintegrating tablet Take 1 tablet (8 mg total) by mouth every 8 (eight) hours as needed for nausea or vomiting. 03/25/15   Molpus, Jonny Ruiz, MD  sulfamethoxazole-trimethoprim (BACTRIM DS,SEPTRA DS) 800-160 MG tablet Take 1 tablet by mouth 2 (two) times daily x 3 days. 09/16/17   Ward, Chase Picket, PA-C    Allergies    Patient has no known allergies.  Review of Systems   Review of Systems  Constitutional: Negative for fever.  HENT: Negative for congestion, dental problem, drooling, ear discharge, ear pain, facial swelling, hearing loss, mouth sores, nosebleeds, postnasal drip, rhinorrhea, sinus pressure, sinus pain, sneezing, sore throat, tinnitus, trouble swallowing and voice change.   Skin: Positive for wound (chin wounds?).    Physical Exam Updated Vital Signs  ED Triage Vitals  Enc Vitals Group     BP 08/12/20 0140 (!) 157/90     Pulse Rate 08/12/20 0140 78     Resp 08/12/20 0140 18     Temp 08/12/20 0140 97.9 F (36.6 C)     Temp Source 08/12/20 0140 Oral     SpO2 08/12/20 0140 100 %     Weight  08/12/20 0141 210 lb (95.3 kg)     Height 08/12/20 0141 5\' 7"  (1.702 m)     Head Circumference --      Peak Flow --      Pain Score 08/12/20 0141 6     Pain Loc --      Pain Edu? --      Excl. in GC? --     Physical Exam HENT:     Head: Normocephalic.     Nose: Nose normal.     Mouth/Throat:     Mouth: Mucous membranes are moist.     Comments: No dental caries, no major submandibular swelling, no swelling of the lips or tongue Musculoskeletal:     Cervical back: Normal range of motion. No rigidity.  Skin:    Comments: Three areas of folliculitis on the chin no obvious abscess no major erythema or red streaking  Neurological:     Mental Status: She is alert.     ED Results / Procedures / Treatments    Labs (all labs ordered are listed, but only abnormal results are displayed) Labs Reviewed - No data to display  EKG None  Radiology No results found.  Procedures Procedures (including critical care time)  Medications Ordered in ED Medications - No data to display  ED Course  I have reviewed the triage vital signs and the nursing notes.  Pertinent labs & imaging results that were available during my care of the patient were reviewed by me and considered in my medical decision making (see chart for details).    MDM Rules/Calculators/A&P                          Anushree Schwimmer is here for concern for facial wound.  Normal vitals.  No fever.  Patient has three areas of folliculitis on her chin.  Believe that she is has been some cellulitis in these areas and some complicated folliculitis.  We will start her on clindamycin.  Has been using warm compresses with some drainage from one of the three areas of folliculitis.  No concern for deeper space infection.  No dental caries on exam.  Recommend continued use of warm compresses and clindamycin.  Given return precautions.  Discharged in good condition.  No need for I&D at this time.  This chart was dictated using voice recognition software.  Despite best efforts to proofread,  errors can occur which can change the documentation meaning.    Final Clinical Impression(s) / ED Diagnoses Final diagnoses:  Folliculitis    Rx / DC Orders ED Discharge Orders         Ordered    clindamycin (CLEOCIN) 300 MG capsule  3 times daily        08/12/20 0303           14/12/21, DO 08/12/20 14/12/21

## 2020-08-12 NOTE — ED Triage Notes (Signed)
Reports swelling to left lower face for three days that has had thick white drainage at times.  Denies having any dental issues.

## 2021-08-20 ENCOUNTER — Encounter (HOSPITAL_BASED_OUTPATIENT_CLINIC_OR_DEPARTMENT_OTHER): Payer: Self-pay

## 2021-08-20 ENCOUNTER — Emergency Department (HOSPITAL_BASED_OUTPATIENT_CLINIC_OR_DEPARTMENT_OTHER)
Admission: EM | Admit: 2021-08-20 | Discharge: 2021-08-21 | Disposition: A | Discharge status: Home / Self Care | Payer: No Typology Code available for payment source | Attending: Emergency Medicine | Admitting: Emergency Medicine

## 2021-08-20 ENCOUNTER — Other Ambulatory Visit: Payer: Self-pay

## 2021-08-20 DIAGNOSIS — N949 Unspecified condition associated with female genital organs and menstrual cycle: Secondary | ICD-10-CM

## 2021-08-20 DIAGNOSIS — N898 Other specified noninflammatory disorders of vagina: Secondary | ICD-10-CM | POA: Diagnosis present

## 2021-08-20 DIAGNOSIS — N9089 Other specified noninflammatory disorders of vulva and perineum: Secondary | ICD-10-CM | POA: Diagnosis not present

## 2021-08-20 DIAGNOSIS — J45909 Unspecified asthma, uncomplicated: Secondary | ICD-10-CM | POA: Diagnosis not present

## 2021-08-20 LAB — WET PREP, GENITAL
Clue Cells Wet Prep HPF POC: NONE SEEN
Sperm: NONE SEEN
Trich, Wet Prep: NONE SEEN
WBC, Wet Prep HPF POC: >=10 — AB (ref <10)
Yeast Wet Prep HPF POC: NONE SEEN

## 2021-08-20 NOTE — ED Notes (Signed)
ED Provider at bedside. 

## 2021-08-20 NOTE — ED Provider Notes (Signed)
[facility] EMERGENCY DEPARTMENT Provider Note   CSN: [id number] Arrival date & time: 08/20/21  2042     History Chief Complaint  Patient presents with   Vaginal Discharge    Alexandria Espinoza is a 30 y.o. female.  Alexandria Espinoza is a 30 y.o. female with a history of asthma, and GERD, who presents to the ED for evaluation of vaginal discharge and painful genital lesions.  Patient reports about 3 days ago she started noticing a burning pain and discomfort.  She reports this was worse when she urinated.  She reports with this she has noted some vaginal discharge that has sometimes been brown or bloody.  She reports that today she was looking at the area and noticed 2 bumps to the left of her vaginal opening that were painful to the touch.  She has never had similar bumps before.  She reports she tried 1 day Monistat treatment without improvement.  Patient reports she is sexually active with 1 partner over the past few months but uses protection.    The history is provided by the patient and medical records.      Past Medical History:  Diagnosis Date   Asthma    Bacterial infection    Gastroesophageal reflux    Avoid dietary chocolate, caffeine & peppermint    Patient Active Problem List   Diagnosis Date Noted   Obesity (BMI 30-39.9) 04/08/2011   Gastroesophageal reflux     History reviewed. No pertinent surgical history.   OB History   No obstetric history on file.     Family History  Problem Relation Age of Onset   GER disease Brother     Social History   Tobacco Use   Smoking status: Never   Smokeless tobacco: Never  Vaping Use   Vaping Use: Never used  Substance Use Topics   Alcohol use: Yes    Comment: occ   Drug use: Yes    Types: Marijuana    Home Medications Prior to Admission medications   Medication Sig Start Date End Date Taking? Authorizing Provider  doxycycline (VIBRAMYCIN) 100 MG capsule Take 1 capsule (100 mg total) by mouth 2 (two)  times daily. Fill and take prescription only if you test positive for chlamydia. 08/21/21  Yes Dartha Lodge, PA-C  valACYclovir (VALTREX) 1000 MG tablet Take 1 tablet (1,000 mg total) by mouth daily. 08/20/21  Yes Dartha Lodge, PA-C  benzonatate (TESSALON) 100 MG capsule Take 1 capsule (100 mg total) by mouth 3 (three) times daily as needed for cough. 02/03/18   Ward, Chase Picket, PA-C  HYDROcodone-acetaminophen (NORCO/VICODIN) 5-325 MG per tablet Take 1-2 tablets by mouth every 6 (six) hours as needed. 03/25/15   Molpus, John, MD  methocarbamol (ROBAXIN) 500 MG tablet Take 1 tablet (500 mg total) by mouth 2 (two) times daily. 09/16/17   Ward, Chase Picket, PA-C  ondansetron (ZOFRAN ODT) 8 MG disintegrating tablet Take 1 tablet (8 mg total) by mouth every 8 (eight) hours as needed for nausea or vomiting. 03/25/15   Molpus, Jonny Ruiz, MD    Allergies    Patient has no known allergies.  Review of Systems   Review of Systems  Constitutional:  Negative for chills and fever.  Gastrointestinal:  Negative for abdominal pain, nausea and vomiting.  Genitourinary:  Positive for dysuria, genital sores and vaginal discharge.  Musculoskeletal:  Negative for arthralgias and myalgias.  Skin:  Negative for color change and rash.  All other systems reviewed and  are negative.  Physical Exam Updated Vital Signs BP 137/76 (BP Location: Left Arm)    Pulse 80    Temp 98.1 F (36.7 C) (Oral)    Resp 18    Ht 5\' 7"  (1.702 m)    Wt 98.4 kg    SpO2 100%    BMI 33.99 kg/m   Physical Exam Vitals and nursing note reviewed.  Constitutional:      General: She is not in acute distress.    Appearance: Normal appearance. She is well-developed and normal weight. She is not ill-appearing or diaphoretic.  HENT:     Head: Normocephalic and atraumatic.     Mouth/Throat:     Mouth: Mucous membranes are moist.     Pharynx: Oropharynx is clear.  Eyes:     General:        Right eye: No discharge.        Left eye: No  discharge.  Cardiovascular:     Rate and Rhythm: Normal rate.  Pulmonary:     Effort: Pulmonary effort is normal. No respiratory distress.  Abdominal:     General: Bowel sounds are normal. There is no distension.     Palpations: Abdomen is soft.     Tenderness: There is no abdominal tenderness. There is no guarding.  Genitourinary:    Comments: Chaperone present during pelvic exam. There is a cluster of vesicular lesions just below the introitus, these are tender to palpation. There are a few small similar lesions noted just inside the vagina.  There is some erythema of the vaginal walls.  Moderate amount of white-yellow discharge.  Cervix appears normal. On bimanual exam there is no cervical motion tenderness, no focal adnexal tenderness or masses. Skin:    General: Skin is warm and dry.     Findings: No rash.  Neurological:     Mental Status: She is alert and oriented to person, place, and time.     Coordination: Coordination normal.  Psychiatric:        Mood and Affect: Mood normal.        Behavior: Behavior normal.    ED Results / Procedures / Treatments   Labs (all labs ordered are listed, but only abnormal results are displayed) Labs Reviewed  WET PREP, GENITAL - Abnormal; Notable for the following components:      Result Value   WBC, Wet Prep HPF POC >=10 (*)    All other components within normal limits  URINALYSIS, ROUTINE W REFLEX MICROSCOPIC - Abnormal; Notable for the following components:   APPearance HAZY (*)    Hgb urine dipstick TRACE (*)    All other components within normal limits  URINALYSIS, MICROSCOPIC (REFLEX) - Abnormal; Notable for the following components:   Bacteria, UA RARE (*)    All other components within normal limits  PREGNANCY, URINE  RPR  HIV ANTIBODY (ROUTINE TESTING W REFLEX)  GC/CHLAMYDIA PROBE AMP ([place]) NOT AT [facility]    EKG None  Radiology No results found.  Procedures Procedures   Medications Ordered in ED Medications   lidocaine (PF) (XYLOCAINE) 1 % injection (has no administration in time range)  cefTRIAXone (ROCEPHIN) injection 500 mg (500 mg Intramuscular Given 08/21/21 0030)    ED Course  I have reviewed the triage vital signs and the nursing notes.  Pertinent labs & imaging results that were available during my care of the patient were reviewed by me and considered in my medical decision making (see chart for details).  MDM Rules/Calculators/A&P                          30 year old female presents with vaginal discharge and painful vaginal lesions.  On exam she has a cluster of vesicular lesions just below the vaginal introitus, there are also a few similar lesions just inside the vagina, these are concerning for herpes, they are tender to palpation.  We will treat with Valtrex.  Patient also with a moderate amount of vaginal discharge on exam, she had WBCs present on her wet prep, no evidence of BV, trichomoniasis or yeast.  We will treat with Rocephin for potential gonorrhea and provide patient prescription for doxycycline for potential chlamydia.  Patient is aware she has gonorrhea, chlamydia, HIV and syphilis testing pending will be called by phone if these results are positive.  She will notify any partners of any positive results and seek treatment as needed.  Discussed treatment, and follow-up for herpes infection.  Patient expresses understanding and agreement.  Discharged home in good condition.   Final Clinical Impression(s) / ED Diagnoses Final diagnoses:  Vaginal discharge  Female genital lesion    Rx / DC Orders ED Discharge Orders          Ordered    doxycycline (VIBRAMYCIN) 100 MG capsule  2 times daily        08/21/21 0008    valACYclovir (VALTREX) 1000 MG tablet  Daily        08/21/21 0005             Dartha Lodge, PA-C 08/21/21 0104    Sloan Leiter, DO 08/21/21 2148

## 2021-08-20 NOTE — ED Triage Notes (Signed)
Pt c/o vaginal bloody discharge, pain and burning "two bumps inside my vagina"-sx started 3 days-pain worse after using monostat-NAD-steady gait

## 2021-08-21 LAB — URINALYSIS, ROUTINE W REFLEX MICROSCOPIC
Bilirubin Urine: NEGATIVE
Glucose, UA: NEGATIVE mg/dL
Ketones, ur: NEGATIVE mg/dL
Leukocytes,Ua: NEGATIVE
Nitrite: NEGATIVE
Protein, ur: NEGATIVE mg/dL
Specific Gravity, Urine: >=1.03 (ref 1.005–1.030)
pH: 5.5 (ref 5.0–8.0)

## 2021-08-21 LAB — URINALYSIS, MICROSCOPIC (REFLEX)

## 2021-08-21 LAB — RPR: RPR Ser Ql: NONREACTIVE

## 2021-08-21 LAB — PREGNANCY, URINE: Preg Test, Ur: NEGATIVE

## 2021-08-21 LAB — HIV ANTIBODY (ROUTINE TESTING W REFLEX): HIV Screen 4th Generation wRfx: NONREACTIVE

## 2021-08-21 MED ORDER — DOXYCYCLINE HYCLATE 100 MG PO CAPS: 100.0000 mg | ORAL_CAPSULE | Freq: Two times a day (BID) | ORAL | 0 refills | Status: AC

## 2021-08-21 MED ORDER — CEFTRIAXONE SODIUM 500 MG IJ SOLR
500.0000 mg | Freq: Once | INTRAMUSCULAR | Status: AC
  Administered 2021-08-21: 01:00:00 500 mg via INTRAMUSCULAR
  Filled 2021-08-21: qty 500

## 2021-08-21 MED ORDER — LIDOCAINE HCL (PF) 1 % IJ SOLN
INTRAMUSCULAR | Status: AC
  Filled 2021-08-21: qty 5

## 2021-08-21 MED ORDER — VALACYCLOVIR HCL 1 G PO TABS: 1000.0000 mg | ORAL_TABLET | Freq: Every day | ORAL | 0 refills | Status: AC

## 2021-08-21 NOTE — Discharge Instructions (Addendum)
I suspect the painful genital lesions you have are due to herpes.  Please take Valtrex 1000 mg once daily for the next 5 days.  Read the information provided on your paperwork today on herpes infection.  You were also tested today for gonorrhea, chlamydia, HIV and syphilis.  Test results typically take 2-3 days to come back, and you will be called by phone with any positive results.  Negative results can also be viewed online through your MyChart account.  You are already treated today for gonorrhea and if your test results are positive for chlamydia you should fill and begin taking doxycycline prescription that you were given today.  If you have any positive test results you will need to notify any partners so that they can be tested and treated as well.  Please make sure you are using protection when sexually active.  You can go to the health department or your gynecologist for any future STD testing needs.

## 2021-08-22 LAB — GC/CHLAMYDIA PROBE AMP (~~LOC~~) NOT AT ARMC
Chlamydia: NEGATIVE
Comment: NEGATIVE
Comment: NORMAL
Neisseria Gonorrhea: NEGATIVE
